# Patient Record
Sex: Male | Born: 1985 | Race: White | Hispanic: No | Marital: Married | Smoking: Never smoker
Health system: Southern US, Community
[De-identification: ages and names within clinical notes are randomized; demographics above are authoritative.]

---
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
## 2005-10-24 ENCOUNTER — Ambulatory Visit: Payer: Self-pay | Admitting: Internal Medicine

## 2005-10-25 ENCOUNTER — Ambulatory Visit (HOSPITAL_COMMUNITY): Admission: RE | Admit: 2005-10-25 | Discharge: 2005-10-25 | Payer: Self-pay | Admitting: Internal Medicine

## 2005-11-07 ENCOUNTER — Ambulatory Visit: Payer: Self-pay | Admitting: Internal Medicine

## 2005-11-18 ENCOUNTER — Ambulatory Visit: Payer: Self-pay | Admitting: Gastroenterology

## 2005-11-28 ENCOUNTER — Encounter (HOSPITAL_COMMUNITY): Admission: RE | Admit: 2005-11-28 | Discharge: 2005-12-10 | Payer: Self-pay | Admitting: Gastroenterology

## 2005-12-12 ENCOUNTER — Ambulatory Visit: Payer: Self-pay | Admitting: Gastroenterology

## 2005-12-12 ENCOUNTER — Ambulatory Visit (HOSPITAL_COMMUNITY): Admission: RE | Admit: 2005-12-12 | Discharge: 2005-12-12 | Payer: Self-pay | Admitting: Gastroenterology

## 2005-12-20 ENCOUNTER — Ambulatory Visit (HOSPITAL_COMMUNITY): Admission: RE | Admit: 2005-12-20 | Discharge: 2005-12-20 | Payer: Self-pay | Admitting: Gastroenterology

## 2006-04-18 ENCOUNTER — Encounter: Payer: Self-pay | Admitting: Internal Medicine

## 2006-04-18 DIAGNOSIS — R110 Nausea: Secondary | ICD-10-CM

## 2006-05-03 ENCOUNTER — Ambulatory Visit: Payer: Self-pay | Admitting: Internal Medicine

## 2006-05-03 DIAGNOSIS — H6590 Unspecified nonsuppurative otitis media, unspecified ear: Secondary | ICD-10-CM | POA: Insufficient documentation

## 2008-03-04 IMAGING — CT CT HEAD W/O CM
1 series · 16 of 30 positions shown, 20 images · non-contrast
Comparison: none

HISTORY: Headache, nausea

[Series 4701: — · axial · 0.43mm/px · z∈[-674,-539]mm · 16 of 30 slices shown, 20 images]
[im 2/30  brain]
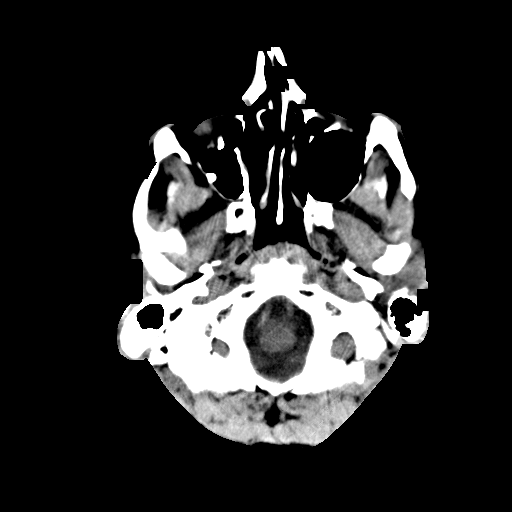
[im 2/30  bone]
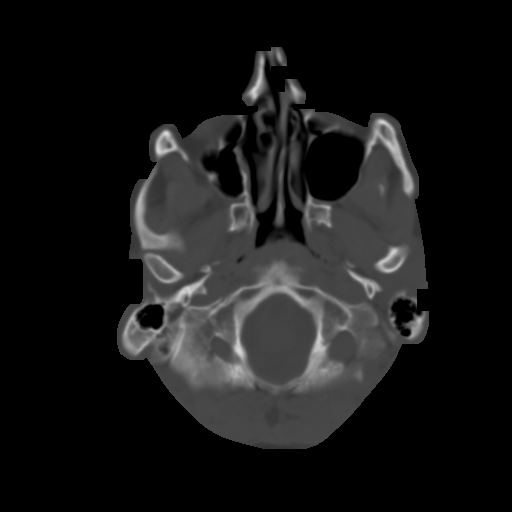
[im 4/30  brain]
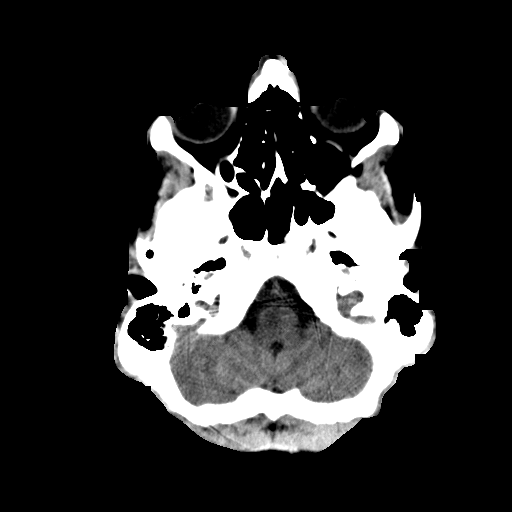
[im 6/30  brain]
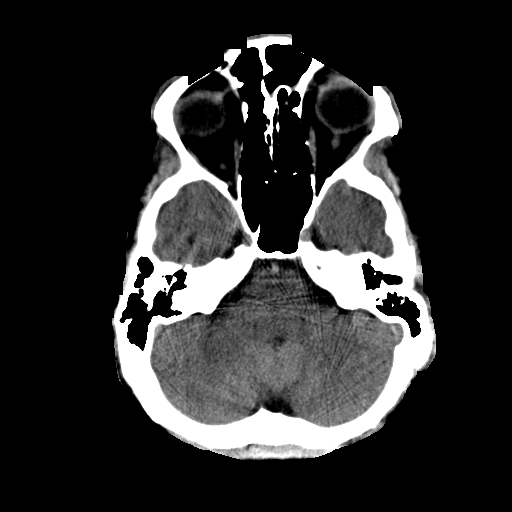
[im 8/30  brain]
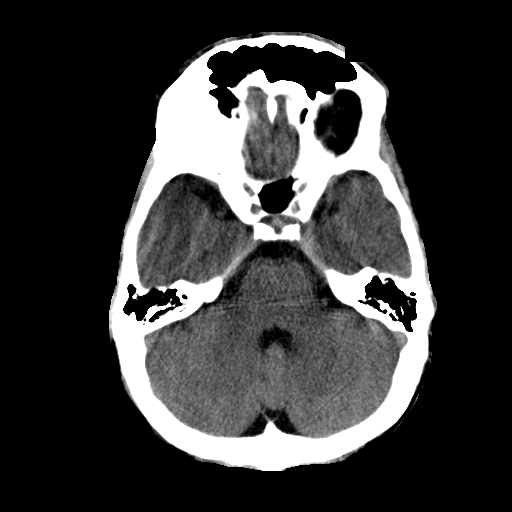
[im 9/30  brain]
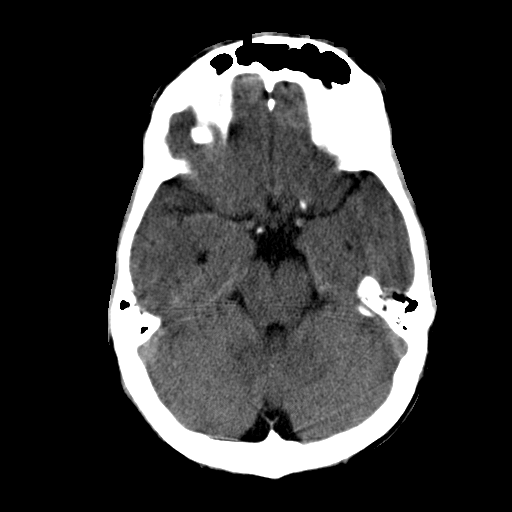
[im 9/30  bone]
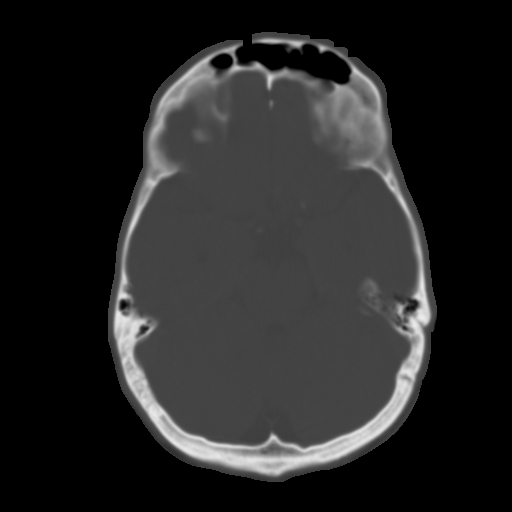
[im 11/30  brain]
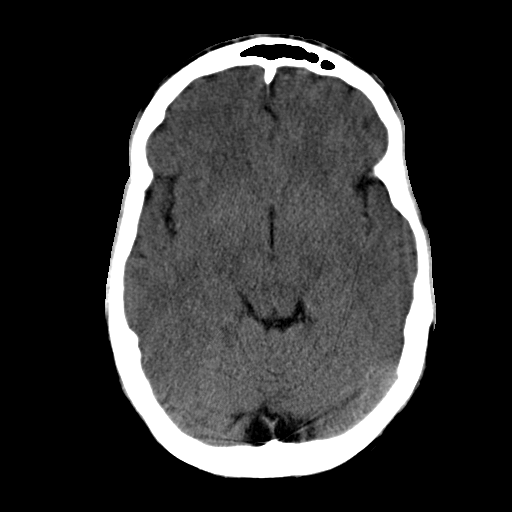
[im 13/30  brain]
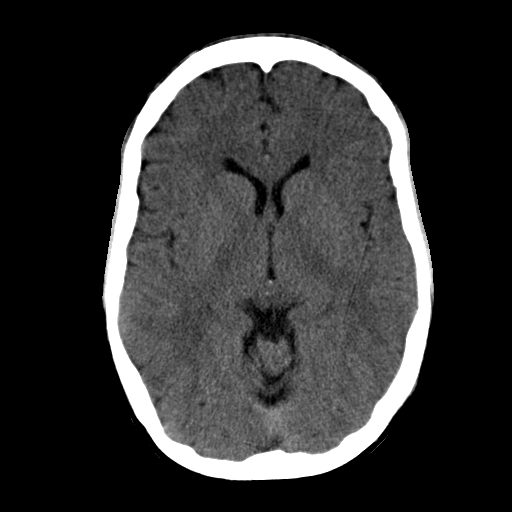
[im 15/30  brain]
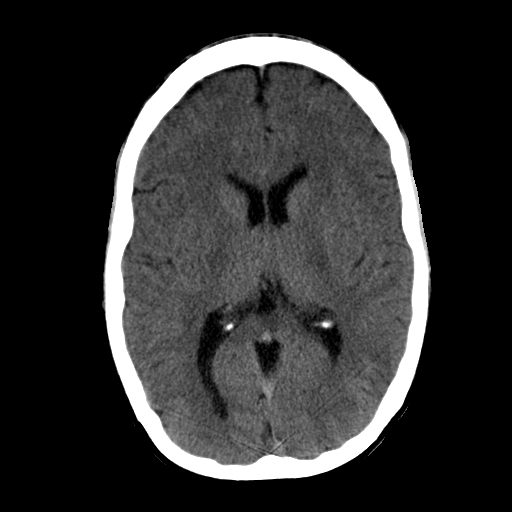
[im 16/30  brain]
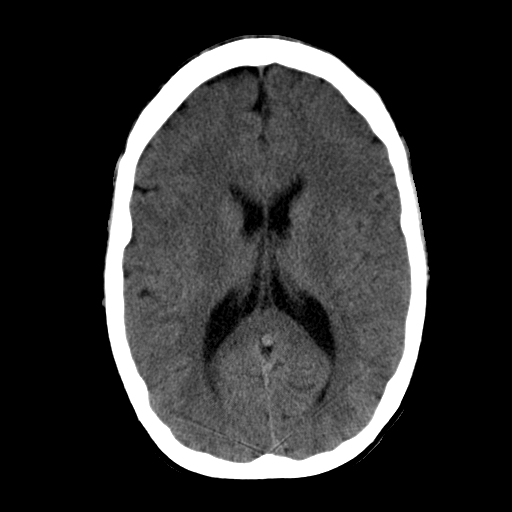
[im 16/30  bone]
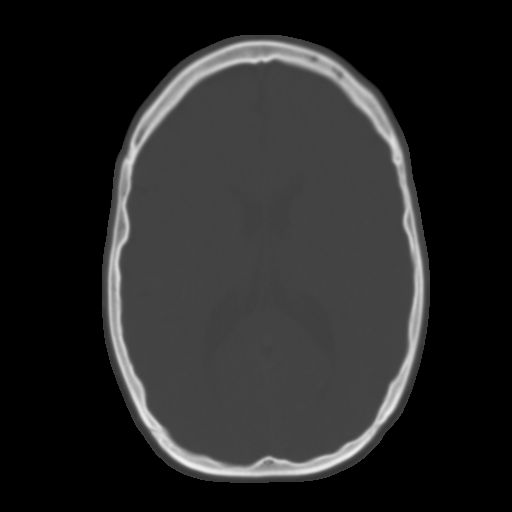
[im 18/30  brain]
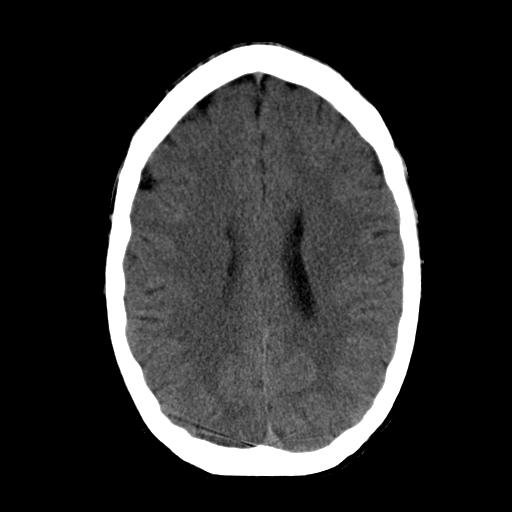
[im 20/30  brain]
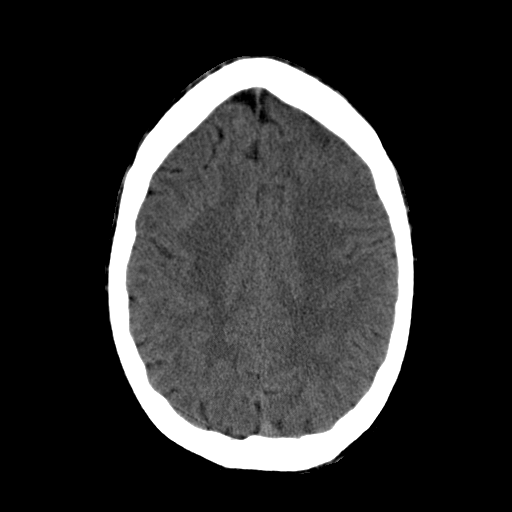
[im 22/30  brain]
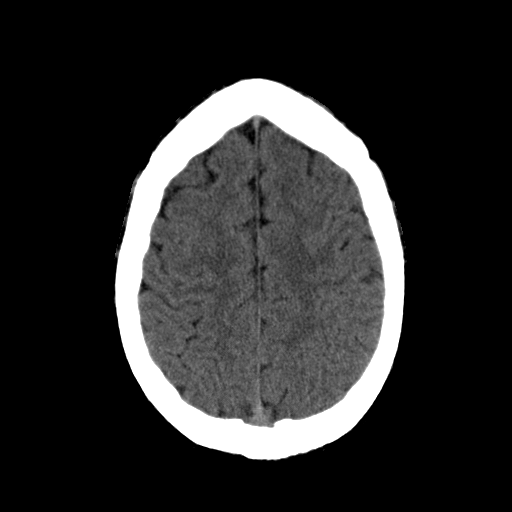
[im 23/30  brain]
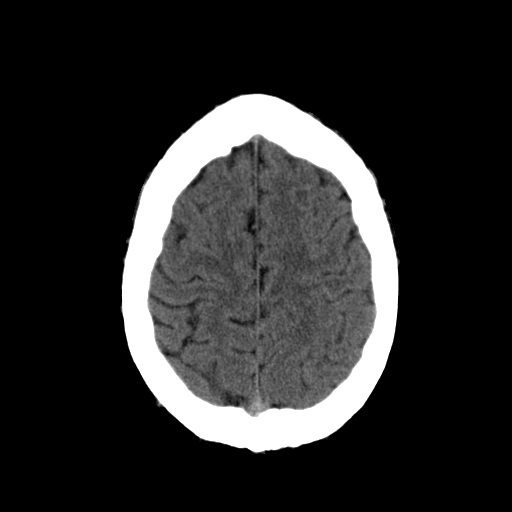
[im 23/30  bone]
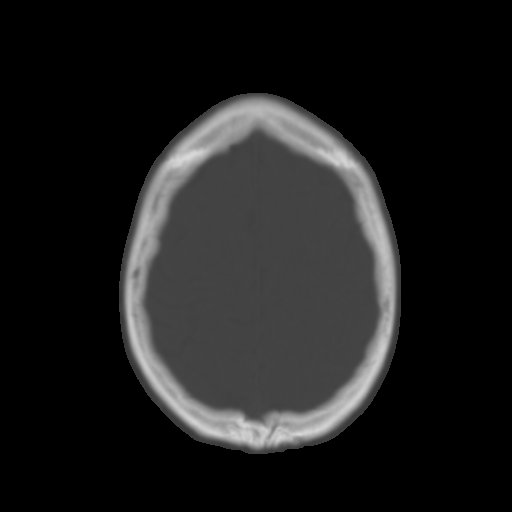
[im 25/30  brain]
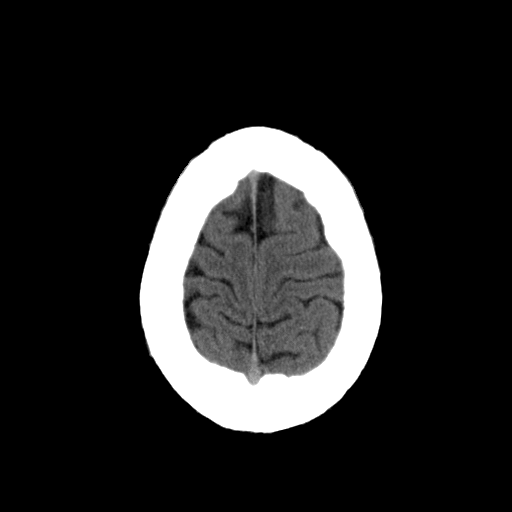
[im 27/30  brain]
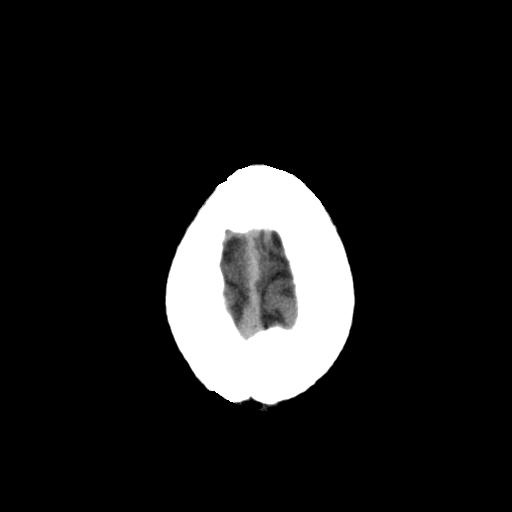
[im 29/30  brain]
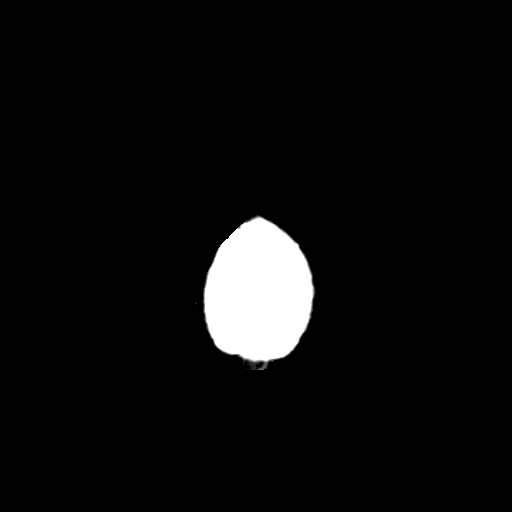

[16 of 30 positions shown; findings below may reference images not displayed]

CT HEAD WITHOUT CONTRAST:

Routine noncontrast CT head without priors for comparison.

Normal ventricular morphology.
No midline shift or mass-effect.
Normal appearance of brain parenchyma.
No mass, hemorrhage, or infarct.
No extra-axial fluid collections.
Reconstruction artifacts at base of posterior fossa and at brainstem.
Visualized sinuses and mastoid air cells clear.
No focal bone abnormalities.
IMPRESSION: No acute abnormalities.
If symptoms persist, consider followup MRI brain.

## 2008-12-10 ENCOUNTER — Encounter (INDEPENDENT_AMBULATORY_CARE_PROVIDER_SITE_OTHER): Payer: Self-pay | Admitting: Internal Medicine

## 2017-06-15 DIAGNOSIS — D225 Melanocytic nevi of trunk: Secondary | ICD-10-CM | POA: Diagnosis not present

## 2017-06-15 DIAGNOSIS — D485 Neoplasm of uncertain behavior of skin: Secondary | ICD-10-CM | POA: Diagnosis not present

## 2017-06-16 DIAGNOSIS — Z6824 Body mass index (BMI) 24.0-24.9, adult: Secondary | ICD-10-CM | POA: Diagnosis not present

## 2017-06-16 DIAGNOSIS — L309 Dermatitis, unspecified: Secondary | ICD-10-CM | POA: Diagnosis not present

## 2017-06-19 ENCOUNTER — Other Ambulatory Visit (HOSPITAL_COMMUNITY): Payer: Self-pay | Admitting: Internal Medicine

## 2017-06-19 DIAGNOSIS — N5089 Other specified disorders of the male genital organs: Secondary | ICD-10-CM

## 2017-06-23 ENCOUNTER — Ambulatory Visit (HOSPITAL_COMMUNITY)
Admission: RE | Admit: 2017-06-23 | Discharge: 2017-06-23 | Disposition: A | Discharge status: Home / Self Care | Payer: 59 | Source: Ambulatory Visit | Attending: Internal Medicine | Admitting: Internal Medicine

## 2017-06-23 DIAGNOSIS — N433 Hydrocele, unspecified: Secondary | ICD-10-CM | POA: Diagnosis not present

## 2017-06-23 DIAGNOSIS — N509 Disorder of male genital organs, unspecified: Secondary | ICD-10-CM | POA: Insufficient documentation

## 2017-06-23 DIAGNOSIS — N5089 Other specified disorders of the male genital organs: Secondary | ICD-10-CM

## 2017-07-06 DIAGNOSIS — D485 Neoplasm of uncertain behavior of skin: Secondary | ICD-10-CM | POA: Diagnosis not present

## 2017-07-06 DIAGNOSIS — L905 Scar conditions and fibrosis of skin: Secondary | ICD-10-CM | POA: Diagnosis not present

## 2017-07-10 DIAGNOSIS — Z6824 Body mass index (BMI) 24.0-24.9, adult: Secondary | ICD-10-CM | POA: Diagnosis not present

## 2017-07-10 DIAGNOSIS — L309 Dermatitis, unspecified: Secondary | ICD-10-CM | POA: Diagnosis not present

## 2017-07-20 DIAGNOSIS — L309 Dermatitis, unspecified: Secondary | ICD-10-CM | POA: Diagnosis not present

## 2017-07-20 DIAGNOSIS — Z0000 Encounter for general adult medical examination without abnormal findings: Secondary | ICD-10-CM | POA: Diagnosis not present

## 2019-10-16 ENCOUNTER — Other Ambulatory Visit: Payer: Self-pay

## 2019-10-16 ENCOUNTER — Emergency Department (HOSPITAL_COMMUNITY)
Admission: EM | Admit: 2019-10-16 | Discharge: 2019-10-17 | Disposition: A | Discharge status: Home / Self Care | Payer: 59 | Attending: Emergency Medicine | Admitting: Emergency Medicine

## 2019-10-16 ENCOUNTER — Emergency Department (HOSPITAL_COMMUNITY): Payer: 59

## 2019-10-16 ENCOUNTER — Encounter (HOSPITAL_COMMUNITY): Payer: Self-pay | Admitting: Emergency Medicine

## 2019-10-16 DIAGNOSIS — U071 COVID-19: Secondary | ICD-10-CM | POA: Diagnosis not present

## 2019-10-16 DIAGNOSIS — R0789 Other chest pain: Secondary | ICD-10-CM | POA: Diagnosis not present

## 2019-10-16 DIAGNOSIS — R079 Chest pain, unspecified: Secondary | ICD-10-CM

## 2019-10-16 LAB — CBC
HCT: 45.6 % (ref 39.0–52.0)
Hemoglobin: 14.9 g/dL (ref 13.0–17.0)
MCH: 28.9 pg (ref 26.0–34.0)
MCHC: 32.7 g/dL (ref 30.0–36.0)
MCV: 88.4 fL (ref 80.0–100.0)
Platelets: 199 10*3/uL (ref 150–400)
RBC: 5.16 MIL/uL (ref 4.22–5.81)
RDW: 12.3 % (ref 11.5–15.5)
WBC: 5.7 10*3/uL (ref 4.0–10.5)
nRBC: 0 % (ref 0.0–0.2)

## 2019-10-16 LAB — BASIC METABOLIC PANEL
Anion gap: 9 (ref 5–15)
BUN: 16 mg/dL (ref 6–20)
CO2: 25 mmol/L (ref 22–32)
Calcium: 8.5 mg/dL — ABNORMAL LOW (ref 8.9–10.3)
Chloride: 106 mmol/L (ref 98–111)
Creatinine, Ser: 0.7 mg/dL (ref 0.61–1.24)
GFR calc Af Amer: >60 mL/min (ref >60)
GFR calc non Af Amer: >60 mL/min (ref >60)
Glucose, Bld: 112 mg/dL — ABNORMAL HIGH (ref 70–99)
Potassium: 3.6 mmol/L (ref 3.5–5.1)
Sodium: 140 mmol/L (ref 135–145)

## 2019-10-16 LAB — TROPONIN I (HIGH SENSITIVITY)
Troponin I (High Sensitivity): 2 ng/L (ref <18)
Troponin I (High Sensitivity): 2 ng/L (ref <18)

## 2019-10-16 MED ORDER — ASPIRIN 81 MG PO CHEW
324.0000 mg | CHEWABLE_TABLET | Freq: Once | ORAL | Status: AC
  Administered 2019-10-16: 324 mg via ORAL
  Filled 2019-10-16: qty 4

## 2019-10-16 NOTE — ED Triage Notes (Signed)
Pt reports diagnosed with covid last Thursday. Pt reports generalized body aches for last several days but reports chest pain started last night. Pt reports nausea and intermittent shortness of breath.

## 2019-10-16 NOTE — ED Provider Notes (Signed)
[facility] Provider Note   CSN: [id number] Arrival date & time: 10/16/19  1821     History Chief Complaint  Patient presents with  . Chest Pain  . COVID +    [facility] is a 34 y.o. male who presents emergency department with chief complaint of chest pain.  Patient has had Covid symptoms for about 2 weeks and tested positive about 1 week ago.  He complains of chest pressure and pain which is nonexertional, nonpositional.  He took Tylenol earlier without relief of his symptoms.  He denies unilateral leg swelling, hemoptysis, pleuritic chest pain.  He has not been running fevers.  He denies any shortness of breath, diaphoresis or radiation to the left arm.  He denies a history of smoking, hypertension, hyperlipidemia or family history of early MI.  He is not diabetic.  HPI  HPI: A 34 year old patient presents for evaluation of chest pain. Initial onset of pain was less than one hour ago. The patient's chest pain is described as heaviness/pressure/tightness and is not worse with exertion. The patient's chest pain is middle- or left-sided, is not well-localized, is not sharp and does not radiate to the arms/jaw/neck. The patient does not complain of nausea and denies diaphoresis. The patient has no history of stroke, has no history of peripheral artery disease, has not smoked in the past 90 days, denies any history of treated diabetes, has no relevant family history of coronary artery disease (first degree relative at less than age 34), is not hypertensive, has no history of hypercholesterolemia and does not have an elevated BMI (>=30).   History reviewed. No pertinent past medical history.  Patient Active Problem List   Diagnosis Date Noted  . OTITIS MEDIA, SEROUS 05/03/2006  . SYMPTOM, NAUSEA ALONE 04/18/2006    History reviewed. No pertinent surgical history.     History reviewed. No pertinent family history.  Social History   Tobacco Use  . Smoking  status: Never Smoker  . Smokeless tobacco: Never Used  Substance Use Topics  . Alcohol use: Never  . Drug use: Never    Home Medications Prior to Admission medications   Not on File    Allergies    Patient has no known allergies.  Review of Systems   Review of Systems Ten systems reviewed and are negative for acute change, except as noted in the HPI.   Physical Exam Updated Vital Signs BP 121/74   Pulse 68   Temp 98.6 F (37 C) (Oral)   Resp 20   Ht 6' (1.829 m)   Wt 70.3 kg   SpO2 99%   BMI 21.02 kg/m   Physical Exam Physical Exam  Nursing note and vitals reviewed. Constitutional: He appears well-developed and well-nourished. No distress.  HENT:  Head: Normocephalic and atraumatic.  Eyes: Conjunctivae normal are normal. No scleral icterus.  Neck: Normal range of motion. Neck supple.  Cardiovascular: Normal rate, regular rhythm and normal heart sounds.   Pulmonary/Chest: Effort normal and breath sounds normal. No respiratory distress.  Abdominal: Soft. There is no tenderness.  Musculoskeletal: He exhibits no edema.  Neurological: He is alert.  Skin: Skin is warm and dry. He is not diaphoretic.  Psychiatric: His behavior is normal.   ED Results / Procedures / Treatments   Labs (all labs ordered are listed, but only abnormal results are displayed) Labs Reviewed  BASIC METABOLIC PANEL - Abnormal; Notable for the following components:      Result Value  Glucose, Bld 112 (*)    Calcium 8.5 (*)    All other components within normal limits  CBC  TROPONIN I (HIGH SENSITIVITY)  TROPONIN I (HIGH SENSITIVITY)    EKG EKG Interpretation  Date/Time:  Wednesday October 16 2019 18:30:55 EDT Ventricular Rate:  105 PR Interval:  140 QRS Duration: 90 QT Interval:  330 QTC Calculation: 436 R Axis:   62 Text Interpretation: Sinus tachycardia Otherwise normal ECG No old tracing to compare Confirmed by Delora Fuel ([id number]) on 10/16/2019 6:44:24 PM   Radiology DG  Chest Port 1 View  Result Date: 10/16/2019 CLINICAL DATA:  COVID-19 pneumonia. EXAM: PORTABLE CHEST 1 VIEW COMPARISON:  None. FINDINGS: The heart size and mediastinal contours are within normal limits. Both lungs are clear. The visualized skeletal structures are unremarkable. IMPRESSION: No active disease. Electronically Signed   By: Constance Holster M.D.   On: 10/16/2019 19:57    Procedures Procedures (including critical care time)  Medications Ordered in ED Medications  aspirin chewable tablet 324 mg (324 mg Oral Given 10/16/19 2103)    ED Course  I have reviewed the triage vital signs and the nursing notes.  Pertinent labs & imaging results that were available during my care of the patient were reviewed by me and considered in my medical decision making (see chart for details).    MDM Rules/Calculators/A&P HEAR Score: 1                         Final Clinical Impression(s) / ED Diagnoses Final diagnoses:  Atypical chest pain  34 year old male here with complaint of chest pressure.  Diagnosed with Covid almost 2 weeks ago.  Afebrile and hemodynamically stable here in the emergency department.The emergent differential diagnosis of chest pain includes: Acute coronary syndrome, pericarditis, myocarditis, aortic dissection, pulmonary embolism, tension pneumothorax, pneumonia, and esophageal rupture.  Patient has a heart score of 1.   I personally reviewed the patient's 1 view chest x-ray which shows no acute abnormalities.  EKG shows sinus tachycardia.  The patient has no active complaint of chest pain or pressure.  I have very low suspicion for pulmonary embolus.  He has no hemoptysis, shortness of breath, pleuritic chest pain.  No exertional dyspnea.  Patient appears appropriate for discharge at this time.  Discussed return precautions.    Rx / DC Orders ED Discharge Orders    None       [name], [name] 21/19/41 [id number]    Delora Fuel, MD 14/48/18 1343

## 2019-10-17 NOTE — Discharge Instructions (Signed)
Your work up was negative for any emergent cause of chest pain including heart attack or pulmonary embolus. You do not have any evidence of viral inflammation. Take advil or tylenol for pain. You may follow up with your primary care doctor.  Chest pain comes from many different causes.  SEEK IMMEDIATE MEDICAL ATTENTION IF: You have severe chest pain, especially if the pain is crushing or pressure-like and spreads to the arms, back, neck, or jaw, or if you have sweating, nausea (feeling sick to your stomach), or shortness of breath. THIS IS AN EMERGENCY. Don't wait to see if the pain will go away. Get medical help at once. Call 911 or [phone] (operator). DO NOT drive yourself to the hospital.  Your chest pain gets worse and does not go away with rest.  You have an attack of chest pain lasting longer than usual, despite rest and treatment with the medications your caregiver has prescribed.  You wake from sleep with chest pain or shortness of breath.  You feel dizzy or faint.  You have chest pain not typical of your usual pain for which you originally saw your caregiver.

## 2020-01-08 ENCOUNTER — Other Ambulatory Visit: Payer: 59

## 2020-01-09 ENCOUNTER — Other Ambulatory Visit: Payer: 59

## 2020-01-09 ENCOUNTER — Other Ambulatory Visit: Payer: Self-pay | Admitting: *Deleted

## 2020-01-09 DIAGNOSIS — Z20822 Contact with and (suspected) exposure to covid-19: Secondary | ICD-10-CM

## 2020-01-10 LAB — NOVEL CORONAVIRUS, NAA: SARS-CoV-2, NAA: NOT DETECTED

## 2020-01-10 LAB — SARS-COV-2, NAA 2 DAY TAT

## 2020-01-15 ENCOUNTER — Other Ambulatory Visit: Payer: 59

## 2020-01-16 ENCOUNTER — Other Ambulatory Visit: Payer: 59

## 2020-01-16 ENCOUNTER — Other Ambulatory Visit: Payer: Self-pay

## 2020-01-16 DIAGNOSIS — Z20822 Contact with and (suspected) exposure to covid-19: Secondary | ICD-10-CM

## 2020-01-17 LAB — SARS-COV-2, NAA 2 DAY TAT

## 2020-01-17 LAB — NOVEL CORONAVIRUS, NAA: SARS-CoV-2, NAA: NOT DETECTED

## 2020-01-23 ENCOUNTER — Other Ambulatory Visit: Payer: 59

## 2020-01-23 DIAGNOSIS — Z20822 Contact with and (suspected) exposure to covid-19: Secondary | ICD-10-CM

## 2020-01-24 LAB — SARS-COV-2, NAA 2 DAY TAT

## 2020-01-24 LAB — NOVEL CORONAVIRUS, NAA: SARS-CoV-2, NAA: NOT DETECTED

## 2020-01-30 ENCOUNTER — Other Ambulatory Visit: Payer: Self-pay

## 2020-01-30 ENCOUNTER — Other Ambulatory Visit: Payer: 59

## 2020-01-30 DIAGNOSIS — Z20822 Contact with and (suspected) exposure to covid-19: Secondary | ICD-10-CM

## 2020-01-31 LAB — SPECIMEN STATUS REPORT

## 2020-01-31 LAB — NOVEL CORONAVIRUS, NAA: SARS-CoV-2, NAA: NOT DETECTED

## 2020-01-31 LAB — SARS-COV-2, NAA 2 DAY TAT

## 2020-02-04 ENCOUNTER — Other Ambulatory Visit: Payer: 59

## 2020-02-04 ENCOUNTER — Other Ambulatory Visit: Payer: Self-pay | Admitting: *Deleted

## 2020-02-04 DIAGNOSIS — Z20822 Contact with and (suspected) exposure to covid-19: Secondary | ICD-10-CM

## 2020-02-05 LAB — SPECIMEN STATUS REPORT

## 2020-02-05 LAB — NOVEL CORONAVIRUS, NAA: SARS-CoV-2, NAA: NOT DETECTED

## 2020-02-05 LAB — SARS-COV-2, NAA 2 DAY TAT

## 2020-02-13 ENCOUNTER — Other Ambulatory Visit: Payer: 59

## 2020-02-13 ENCOUNTER — Other Ambulatory Visit: Payer: Self-pay

## 2020-02-13 DIAGNOSIS — Z20822 Contact with and (suspected) exposure to covid-19: Secondary | ICD-10-CM

## 2020-02-15 LAB — NOVEL CORONAVIRUS, NAA: SARS-CoV-2, NAA: NOT DETECTED

## 2020-02-15 LAB — SARS-COV-2, NAA 2 DAY TAT

## 2020-02-20 ENCOUNTER — Other Ambulatory Visit: Payer: 59

## 2020-02-20 ENCOUNTER — Other Ambulatory Visit: Payer: Self-pay

## 2020-02-20 DIAGNOSIS — Z20822 Contact with and (suspected) exposure to covid-19: Secondary | ICD-10-CM

## 2020-02-22 LAB — NOVEL CORONAVIRUS, NAA: SARS-CoV-2, NAA: NOT DETECTED

## 2020-02-22 LAB — SARS-COV-2, NAA 2 DAY TAT

## 2020-02-27 ENCOUNTER — Other Ambulatory Visit: Payer: 59

## 2020-02-27 ENCOUNTER — Other Ambulatory Visit: Payer: Self-pay

## 2020-02-27 DIAGNOSIS — Z20822 Contact with and (suspected) exposure to covid-19: Secondary | ICD-10-CM

## 2020-02-29 LAB — SARS-COV-2, NAA 2 DAY TAT

## 2020-02-29 LAB — NOVEL CORONAVIRUS, NAA: SARS-CoV-2, NAA: NOT DETECTED

## 2020-02-29 LAB — SPECIMEN STATUS REPORT

## 2020-03-05 ENCOUNTER — Other Ambulatory Visit: Payer: 59

## 2020-03-05 DIAGNOSIS — Z20822 Contact with and (suspected) exposure to covid-19: Secondary | ICD-10-CM

## 2020-03-07 LAB — SARS-COV-2, NAA 2 DAY TAT

## 2020-03-07 LAB — NOVEL CORONAVIRUS, NAA: SARS-CoV-2, NAA: NOT DETECTED

## 2020-03-12 ENCOUNTER — Other Ambulatory Visit: Payer: 59

## 2020-03-12 DIAGNOSIS — Z20822 Contact with and (suspected) exposure to covid-19: Secondary | ICD-10-CM

## 2020-03-14 LAB — SARS-COV-2, NAA 2 DAY TAT

## 2020-03-14 LAB — NOVEL CORONAVIRUS, NAA: SARS-CoV-2, NAA: NOT DETECTED

## 2020-03-20 ENCOUNTER — Other Ambulatory Visit: Payer: Self-pay

## 2020-03-20 ENCOUNTER — Other Ambulatory Visit: Payer: 59

## 2020-03-20 DIAGNOSIS — Z20822 Contact with and (suspected) exposure to covid-19: Secondary | ICD-10-CM

## 2020-03-24 LAB — NOVEL CORONAVIRUS, NAA: SARS-CoV-2, NAA: NOT DETECTED

## 2020-03-27 ENCOUNTER — Other Ambulatory Visit: Payer: 59

## 2020-03-27 DIAGNOSIS — Z20822 Contact with and (suspected) exposure to covid-19: Secondary | ICD-10-CM

## 2020-03-30 LAB — NOVEL CORONAVIRUS, NAA: SARS-CoV-2, NAA: NOT DETECTED

## 2020-04-02 ENCOUNTER — Other Ambulatory Visit: Payer: 59

## 2020-04-03 ENCOUNTER — Other Ambulatory Visit: Payer: 59

## 2020-04-04 IMAGING — US US SCROTUM W/ DOPPLER COMPLETE
1 series · 13 of 25 positions shown · non-contrast
Comparison: No recent prior.

CLINICAL DATA: Scrotal mass for 2 years.

EXAM:
SCROTAL ULTRASOUND
DOPPLER ULTRASOUND OF THE TESTICLES
TECHNIQUE: Complete ultrasound examination of the testicles, epididymis, and
other scrotal structures was performed. Color and spectral Doppler
ultrasound were also utilized to evaluate blood flow to the
testicles.

[Series 1: us scrotum w/ doppler complete · 0.07mm/px · 13 of 81 slices shown]
[im 1/81]
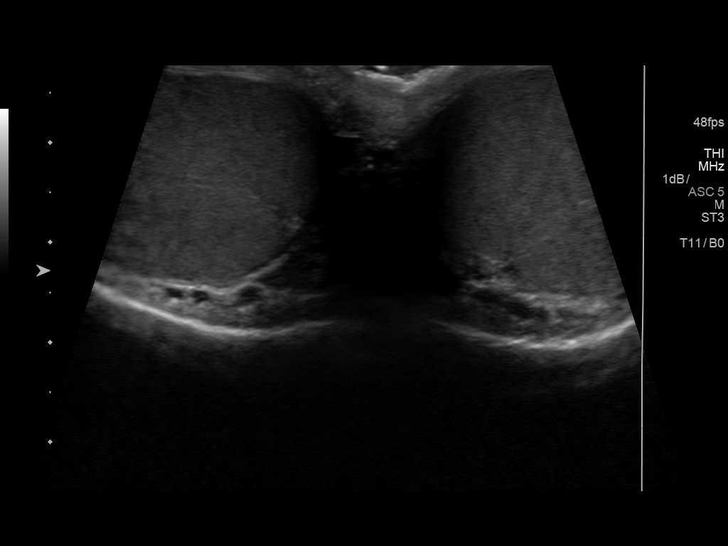
[im 7/81]
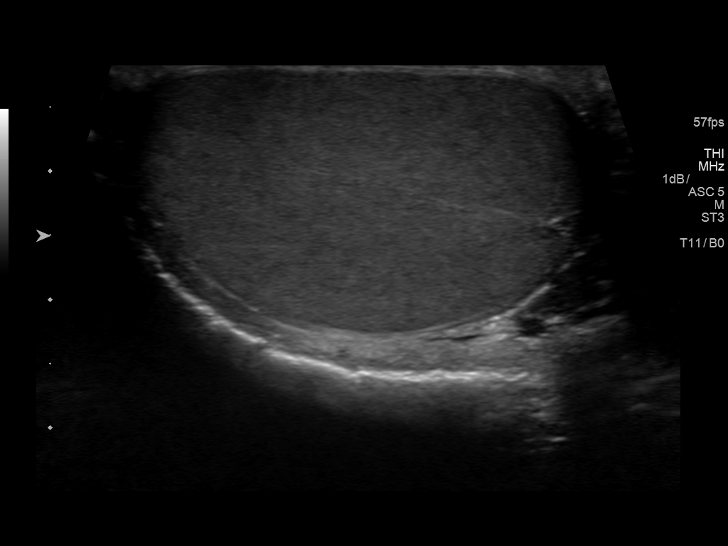
[im 14/81]
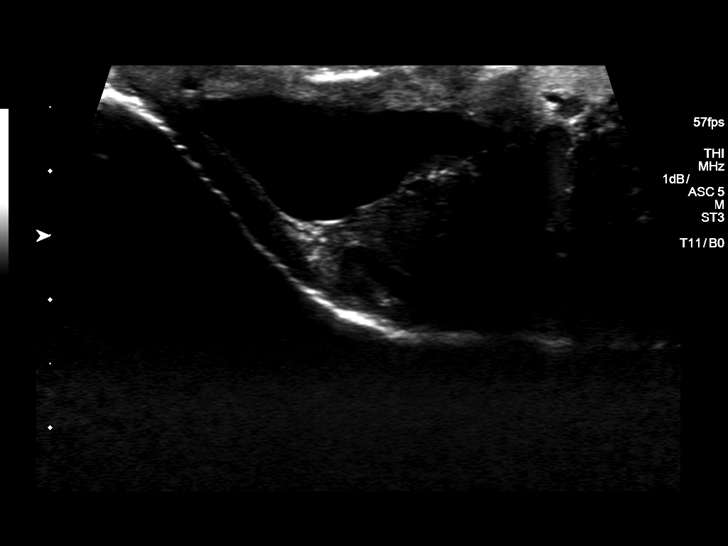
[im 21/81]
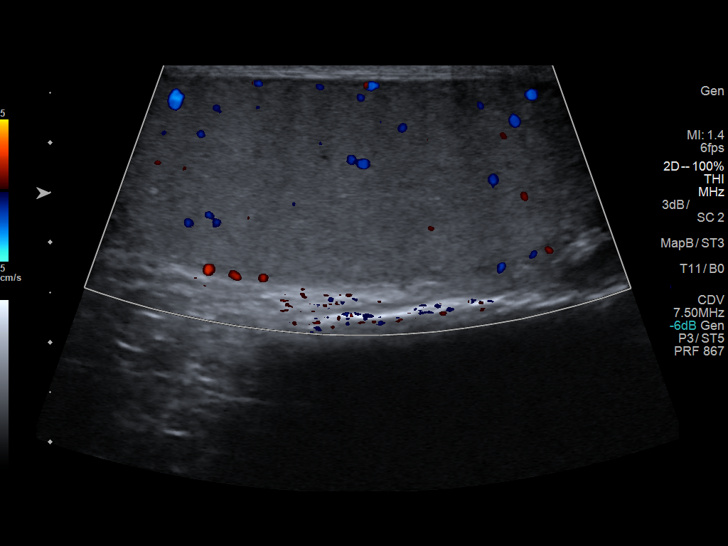
[im 27/81]
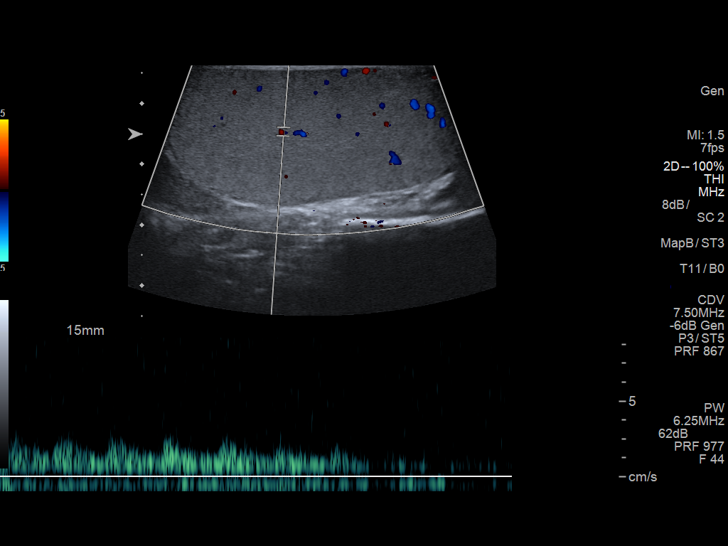
[im 34/81]
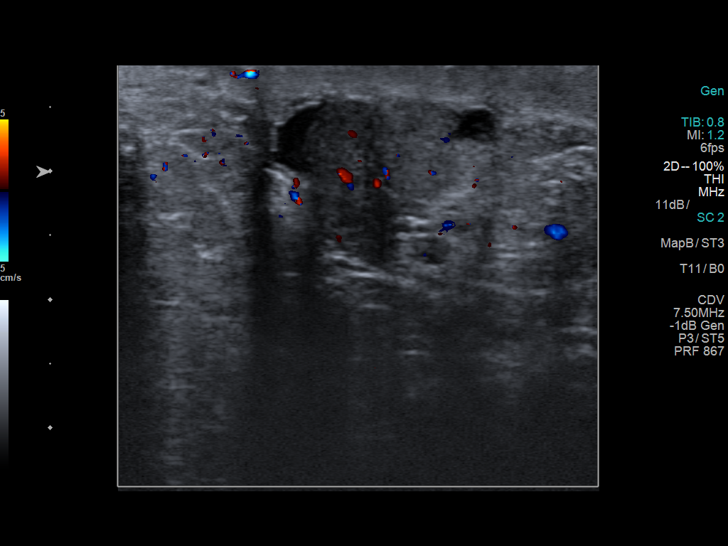
[im 41/81]
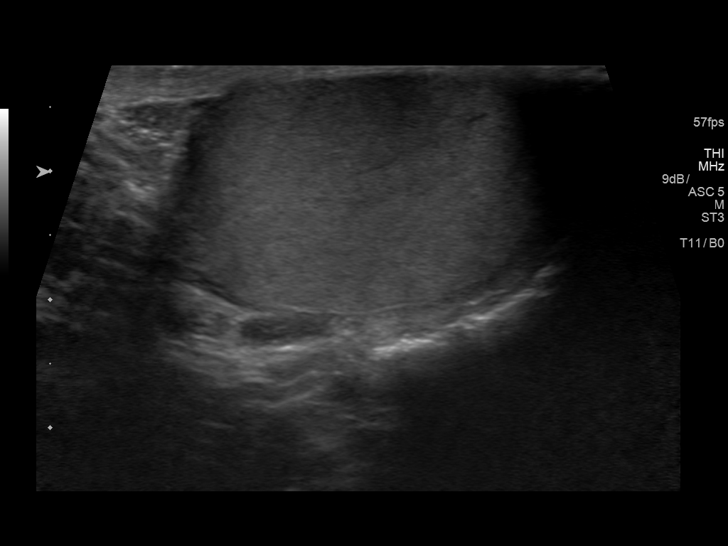
[im 47/81]
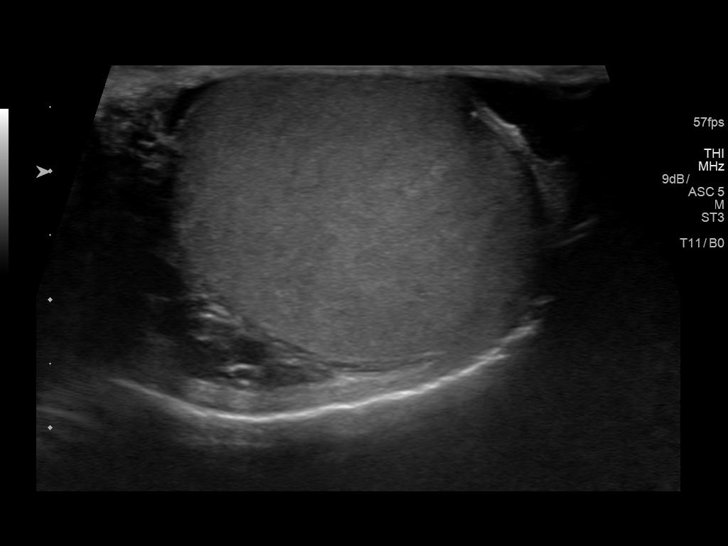
[im 54/81]
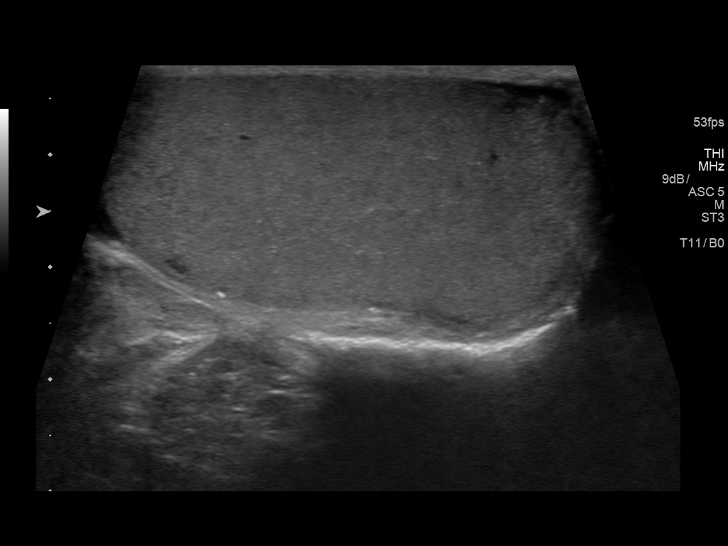
[im 61/81]
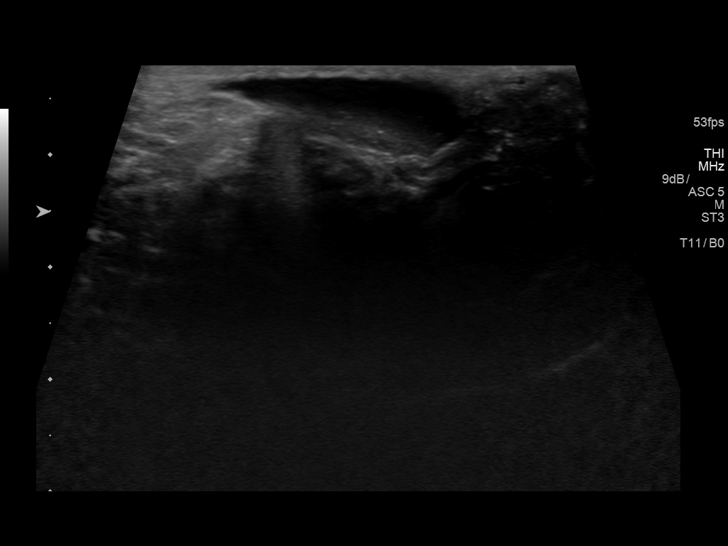
[im 67/81]
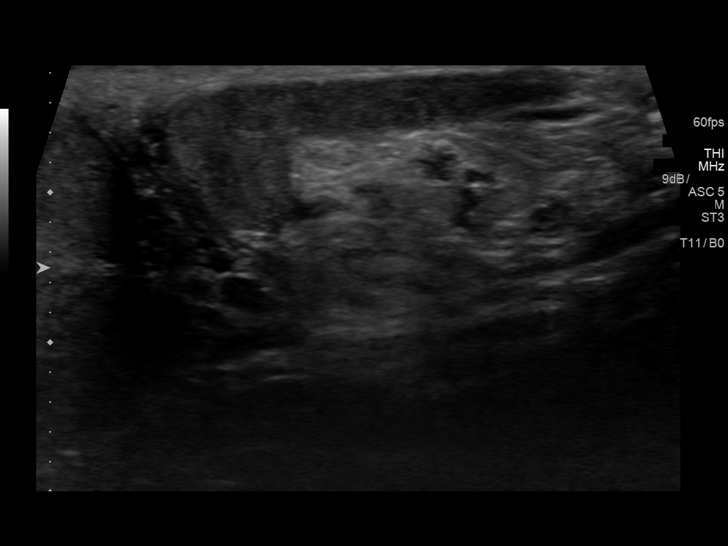
[im 74/81]
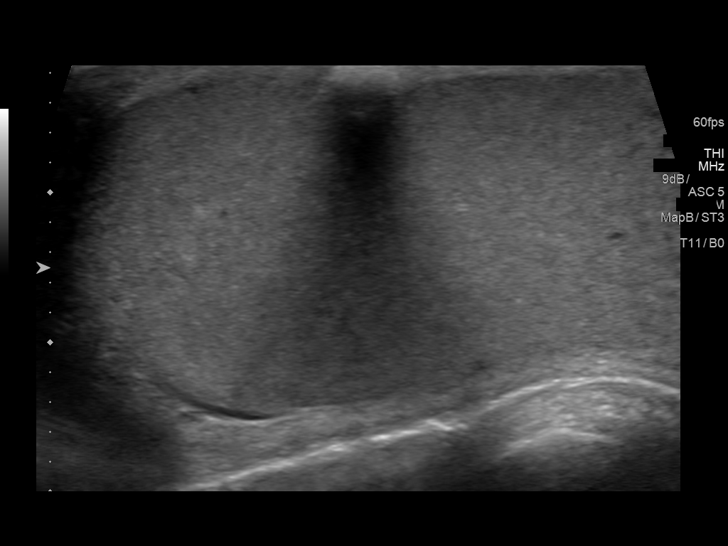
[im 81/81]
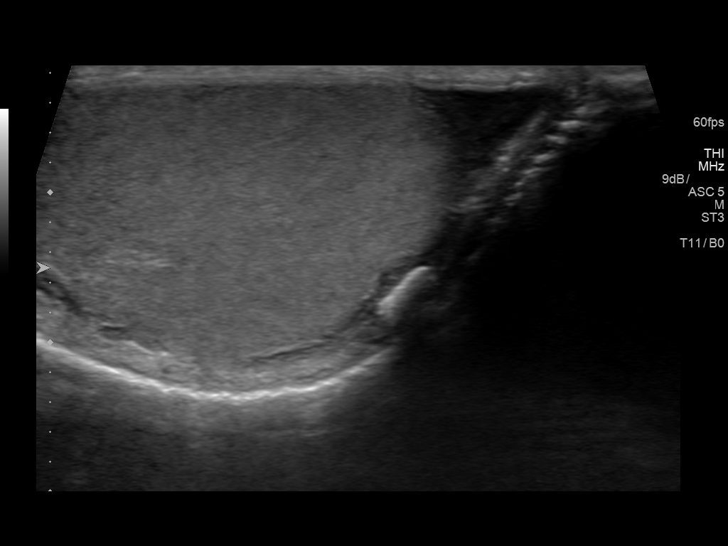

[13 of 25 positions shown; findings below may reference images not displayed]

FINDINGS: Right testicle

Measurements: 4.7 x 2.2 x 3.4 cm. No mass or microlithiasis
visualized.

Left testicle

Measurements: 4.7 x 2.3 x 3.3 cm. No mass or microlithiasis
visualized.

Right epididymis:  Normal in size and appearance.

Left epididymis:  Normal in size and appearance.

Hydrocele: Small bilateral hydroceles. Small mobile shadowing
echogenic focus noted within the left scrotum. This is most likely a
tiny benign stone.

Varicocele:  None visualized.

Pulsed Doppler interrogation of both testes demonstrates normal low
resistance arterial and venous waveforms bilaterally.
IMPRESSION: Number small mobile shadowing echogenic focus noted within the left
scrotum. This most consistent benign tiny stone. No significant
testicular mass lesion noted. No evidence of torsion.

2.  Small bilateral hydroceles.

## 2020-04-10 ENCOUNTER — Other Ambulatory Visit: Payer: 59

## 2020-04-10 DIAGNOSIS — Z20822 Contact with and (suspected) exposure to covid-19: Secondary | ICD-10-CM

## 2020-04-11 LAB — NOVEL CORONAVIRUS, NAA: SARS-CoV-2, NAA: NOT DETECTED

## 2020-04-11 LAB — SARS-COV-2, NAA 2 DAY TAT

## 2020-04-17 ENCOUNTER — Other Ambulatory Visit: Payer: 59

## 2020-04-17 DIAGNOSIS — Z20822 Contact with and (suspected) exposure to covid-19: Secondary | ICD-10-CM

## 2020-04-18 LAB — NOVEL CORONAVIRUS, NAA: SARS-CoV-2, NAA: NOT DETECTED

## 2020-04-18 LAB — SARS-COV-2, NAA 2 DAY TAT

## 2020-04-24 ENCOUNTER — Other Ambulatory Visit: Payer: 59
